# Patient Record
Sex: Male | Born: 1950 | Race: White | Hispanic: No
Health system: Southern US, Community
[De-identification: ages and names within clinical notes are randomized; demographics above are authoritative.]

---

## 2016-10-05 ENCOUNTER — Inpatient Hospital Stay
Admission: AD | Admit: 2016-10-05 | Discharge: 2016-10-27 | Disposition: A | Discharge status: Skilled Nursing Facility | Payer: Medicare Other | Source: Ambulatory Visit | Attending: Internal Medicine | Admitting: Internal Medicine

## 2016-10-05 ENCOUNTER — Institutional Professional Consult (permissible substitution) (HOSPITAL_COMMUNITY): Payer: Medicare Other

## 2016-10-05 DIAGNOSIS — Z0189 Encounter for other specified special examinations: Secondary | ICD-10-CM

## 2016-10-05 DIAGNOSIS — Z4659 Encounter for fitting and adjustment of other gastrointestinal appliance and device: Secondary | ICD-10-CM

## 2016-10-05 DIAGNOSIS — J189 Pneumonia, unspecified organism: Secondary | ICD-10-CM

## 2016-10-05 DIAGNOSIS — J449 Chronic obstructive pulmonary disease, unspecified: Secondary | ICD-10-CM

## 2016-10-05 DIAGNOSIS — J9690 Respiratory failure, unspecified, unspecified whether with hypoxia or hypercapnia: Secondary | ICD-10-CM

## 2016-10-05 DIAGNOSIS — R05 Cough: Secondary | ICD-10-CM

## 2016-10-05 DIAGNOSIS — S37009A Unspecified injury of unspecified kidney, initial encounter: Secondary | ICD-10-CM

## 2016-10-05 DIAGNOSIS — R059 Cough, unspecified: Secondary | ICD-10-CM

## 2016-10-05 DIAGNOSIS — N289 Disorder of kidney and ureter, unspecified: Secondary | ICD-10-CM

## 2016-10-06 ENCOUNTER — Other Ambulatory Visit (HOSPITAL_COMMUNITY): Payer: Medicare Other

## 2016-10-06 LAB — CBC WITH DIFFERENTIAL/PLATELET
BASOS ABS: 0 10*3/uL (ref 0.0–0.1)
Basophils Relative: 0 %
EOS PCT: 0 %
Eosinophils Absolute: 0 10*3/uL (ref 0.0–0.7)
HCT: 25 % — ABNORMAL LOW (ref 39.0–52.0)
Hemoglobin: 8.2 g/dL — ABNORMAL LOW (ref 13.0–17.0)
LYMPHS PCT: 8 %
Lymphs Abs: 0.4 10*3/uL — ABNORMAL LOW (ref 0.7–4.0)
MCH: 31.2 pg (ref 26.0–34.0)
MCHC: 32.8 g/dL (ref 30.0–36.0)
MCV: 95.1 fL (ref 78.0–100.0)
MONO ABS: 0.1 10*3/uL (ref 0.1–1.0)
Monocytes Relative: 3 %
Neutro Abs: 4.1 10*3/uL (ref 1.7–7.7)
Neutrophils Relative %: 89 %
PLATELETS: 48 10*3/uL — AB (ref 150–400)
RBC: 2.63 MIL/uL — ABNORMAL LOW (ref 4.22–5.81)
RDW: 18.1 % — AB (ref 11.5–15.5)
WBC: 4.6 10*3/uL (ref 4.0–10.5)

## 2016-10-06 LAB — COMPREHENSIVE METABOLIC PANEL
ALK PHOS: 53 U/L (ref 38–126)
ALT: 19 U/L (ref 17–63)
AST: 24 U/L (ref 15–41)
Albumin: 2.9 g/dL — ABNORMAL LOW (ref 3.5–5.0)
Anion gap: 13 (ref 5–15)
BILIRUBIN TOTAL: 1.2 mg/dL (ref 0.3–1.2)
BUN: 39 mg/dL — ABNORMAL HIGH (ref 6–20)
CALCIUM: 7.9 mg/dL — AB (ref 8.9–10.3)
CO2: 26 mmol/L (ref 22–32)
Chloride: 108 mmol/L (ref 101–111)
Creatinine, Ser: 3.44 mg/dL — ABNORMAL HIGH (ref 0.61–1.24)
GFR calc non Af Amer: 18 mL/min — ABNORMAL LOW (ref >60)
GFR, EST AFRICAN AMERICAN: 20 mL/min — AB (ref >60)
GLUCOSE: 162 mg/dL — AB (ref 65–99)
Potassium: 2.7 mmol/L — CL (ref 3.5–5.1)
Sodium: 147 mmol/L — ABNORMAL HIGH (ref 135–145)
TOTAL PROTEIN: 5.2 g/dL — AB (ref 6.5–8.1)

## 2016-10-06 LAB — TSH: TSH: 4.23 u[IU]/mL (ref 0.350–4.500)

## 2016-10-06 LAB — PROTIME-INR
INR: 1.41
Prothrombin Time: 17.4 seconds — ABNORMAL HIGH (ref 11.4–15.2)

## 2016-10-06 LAB — MAGNESIUM: Magnesium: 2.1 mg/dL (ref 1.7–2.4)

## 2016-10-06 LAB — PHOSPHORUS: Phosphorus: 2.7 mg/dL (ref 2.5–4.6)

## 2016-10-06 NOTE — Consult Note (Signed)
[facility] CONSULT NOTE    Date: 10/06/2016                  Patient Name:  Luke Robertson  MRN: [id number]  DOB: August 02, 1951  Age / Sex: 66 y.o., male         PCP: No primary care provider on file.                 Service Requesting Consult: Hospitalist                 Reason for Consult: Acute renal failure/CKD stage III            History of Present Illness: Patient is a 66 y.o. male with a PMHx of myocardial infarction, coronary artery disease, hypertension, hyperlipidemia, peripheral arterial disease, paroxysmal supraventricular tachycardia, COPD, history of histoplasmosis infection, chronic kidney disease stage III baseline creatinine 1.3, who was admitted to [facility] on 10/05/2016 for ongoing management of multiple medical problems.  The patient underwent exploratory laparotomy on 09/30/16.  He underwent extensive lysis of adhesions lasti approximately 2 hours.  His postoperative course was complicated with acute respiratory failure requiring mechanical ventilation. He was extubated on 10/01/16. He also developed acute kidney injury. His baseline creatinine is 1.3. [facility] He continues to make urine at this point in time. However creatinine appears to be trending up.  He also has anemia of chronic kidney disease and did receive blood transfusion.  Medications: aspirin (ECOTRIN) 81 MG tablet  Take 81 mg by mouth daily.      atorvastatin (LIPITOR) 20 MG tablet  Take 20 mg by mouth nightly.       B-complex with vitamin C tablet  Take 1 tablet by mouth daily.      tiotropium (SPIRIVA) 18 mcg inhalation capsule  Place 18 mcg into inhaler and inhale daily.      albuterol (PROVENTIL HFA;VENTOLIN HFA) 90 mcg/actuation inhaler  Inhale 2 puffs every four (4) hours as needed for wheezing. 1 Inhaler  0 05/14/2016 05/14/2017  gabapentin (NEURONTIN) 300 MG capsule  Take 1 capsule (300 mg  total) by mouth Three (3) times a day. 90 capsule  0 10/05/2016 11/04/2016  hydrALAZINE (APRESOLINE) 20 mg/mL injection  Infuse 0.5 mL (10 mg total) into a venous catheter every two (2) hours as needed (sbp >160 or dbp >100). 1 mL  0 10/05/2016 11/04/2016  hydrocortisone sod succ (SOLU-CORTEF) SolR  Infuse 1 mL (50 mg total) into a venous catheter daily.  0 10/09/2016   hydrocortisone sod succ (SOLU-CORTEF) SolR  Infuse 1 mL (50 mg total) into a venous catheter every twelve (12) hours.  0 10/06/2016   hydrocortisone sod succ (SOLU-CORTEF) SolR  Infuse 1 mL (50 mg total) into a venous catheter every eight (8) hours.  0 10/05/2016   HYDROmorphone (DILAUDID) 1 mg/mL Soln  Infuse 0.25 mL (0.25 mg total) into a venous catheter every three (3) hours as needed (pain 6-10). for up to 5 days  0 10/05/2016 10/10/2016  labetalol 20 mg/4 mL (5 mg/mL) Syrg  Infuse 2 mL (10 mg total) into a venous catheter every four (4) hours as needed (for SBP > 180).  0 10/05/2016 11/04/2016  Lactobacillus acidophilus cap capsule  Take 1 capsule by mouth Three (3) times a day with a meal. 90 capsule  0 10/05/2016 11/04/2016  multivitamin (TAB-A-VITE/THERAGRAN) per tablet  Take 1 tablet by mouth daily. 30 tablet  11 10/06/2016 10/06/2017  niacin 500 MG tablet  Take 1 tablet (500 mg total) by mouth daily with breakfast. 90 tablet  3 10/06/2016 10/06/2017  nitroglycerin (NITROSTAT) 0.4 MG SL tablet  Place 0.4 mg under the tongue every five (5) minutes as needed.   11/04/2015   nystatin (MYCOSTATIN) 100,000 unit/mL suspension  Take 5 mL (500,000 Units total) by mouth Four (4) times a day. 60 mL  0 10/05/2016   pantoprazole (PROTONIX) 40 mg GrPS  1 packet (40 mg total) by NG tube route daily. 30 packet  0 10/06/2016 11/05/2016  piperacillin-tazobactam (ZOSYN) 2.25 gram/50 mL IVPB  Infuse 50 mL (2.25 g total) into a venous catheter Every six (6) hours. 50 mL  0 10/05/2016   traMADol (ULTRAM) 50 mg  tablet  Take 0.5 tablets (25 mg total) by mouth every four (4) hours as needed (pain). for up to 5 days  0 10/05/2016 10/10/2016     Allergies:    Past Medical History: Myocardial infarction Coronary artery disease Hypertension Hyperlipidemia Peripheral arterial disease Paroxysmal supraventricular tachycardia COPD History of histoplasmosis infection Chronic kidney disease stage III baseline creatinine 1.5  Past Surgical History: Aortobifemoral bypass Penile prosthesis implant Hernia repair Coronary stent placement in 2010   Family History: Father with history of hypertensio mother with history of diabetes mellitus type 2  Social History: Patient has history of 20 pack years of tobacco abuse.  No current alcohol use.   Review of Systems: Review of Systems  Constitutional: Positive for malaise/fatigue and weight loss. Negative for chills and fever.  HENT: Negative for ear discharge, ear pain, hearing loss and tinnitus.   Eyes: Negative for blurred vision, double vision and photophobia.  Respiratory: Positive for cough and shortness of breath. Negative for hemoptysis and sputum production.   Cardiovascular: Negative for chest pain, palpitations and orthopnea.  Gastrointestinal: Positive for abdominal pain and nausea. Negative for heartburn.  Genitourinary: Negative for dysuria, frequency and urgency.  Musculoskeletal: Negative for back pain and myalgias.  Skin: Negative for itching and rash.  Neurological: Negative for dizziness and focal weakness.  Endo/Heme/Allergies: Negative for polydipsia. Does not bruise/bleed easily.  Psychiatric/Behavioral: Negative for depression. The patient is not nervous/anxious.      Vital Signs: Temperature 96.8 pulse 85 respirations 23 blood pressure 137/77  Physical Exam: General: NAD, sitting u in bed  Head: Normocephalic, atraumatic.  Eyes: Anicteric, EOMI  Nose: Mucous membranes moist, not inflammed, nonerythematous.   Throat: Oropharynx nonerythematous, no exudate appreciated.   Neck: Supple, trachea midline.  Lungs:  Normal respiratory effort. Basilar rales  Heart: S1S2 irregular at times  Abdomen:  Distended, bowel sounds present, midline abdominal scar noted.  Extremities: No pretibial edema.  Neurologic: A&O X3, Motor strength is 5/5 in the all 4 extremities  Skin: No visible rashes, scars.    Lab results: Basic Metabolic Panel:  Recent Labs Lab 10/06/16 0548  NA 147*  K 2.7*  CL 108  CO2 26  GLUCOSE 162*  BUN 39*  CREATININE 3.44*  CALCIUM 7.9*  MG 2.1  PHOS 2.7    Liver Function Tests:  Recent Labs Lab 10/06/16 0548  AST 24  ALT 19  ALKPHOS 53  BILITOT 1.2  PROT 5.2*  ALBUMIN 2.9*   No results for input(s): LIPASE, AMYLASE in the last 168 hours. No results for input(s): AMMONIA in the last 168 hours.  CBC:  Recent Labs Lab 10/06/16 0548  WBC 4.6  NEUTROABS 4.1  HGB 8.2*  HCT 25.0*  MCV  95.1  PLT 48*    Cardiac Enzymes: No results for input(s): CKTOTAL, CKMB, CKMBINDEX, TROPONINI in the last 168 hours.  BNP: Invalid input(s): POCBNP  CBG: No results for input(s): GLUCAP in the last 168 hours.  Microbiology: No results found for this or any previous visit.  Coagulation Studies:  Recent Labs  10/06/16 0548  LABPROT 17.4*  INR 1.41    Urinalysis: No results for input(s): COLORURINE, LABSPEC, PHURINE, GLUCOSEU, HGBUR, BILIRUBINUR, KETONESUR, PROTEINUR, UROBILINOGEN, NITRITE, LEUKOCYTESUR in the last 72 hours.  Invalid input(s): APPERANCEUR    Imaging: Dg Chest Port 1 View  Result Date: 10/05/2016 CLINICAL DATA:  Admission chest radiograph.  Initial encounter. EXAM: PORTABLE CHEST 1 VIEW COMPARISON:  None. FINDINGS: The patient's enteric tube is noted extending below the diaphragm. Vascular congestion is noted. Increased interstitial markings raise concern for pulmonary edema. Small bilateral pleural effusions are seen. No pneumothorax is  identified. The cardiomediastinal silhouette is mildly enlarged. No acute osseous abnormalities are identified. IMPRESSION: Vascular congestion and mild cardiomegaly. Increased interstitial markings raise concern for pulmonary edema. Small bilateral pleural effusions seen. Electronically Signed   By: Roanna Raider M.D.   On: 10/05/2016 17:18   Dg Abd Portable 1v  Result Date: 10/06/2016 CLINICAL DATA:  Nasogastric tube replacement EXAM: PORTABLE ABDOMEN - 1 VIEW COMPARISON:  Yesterday FINDINGS: Nasogastric tube tip is at the descending segment duodenum. Unchanged obscuration of the left diaphragm. Nonobstructive bowel gas pattern. IMPRESSION: Nasogastric tube tip is at the proximal duodenum. Electronically Signed   By: Marnee Spring M.D.   On: 10/06/2016 07:57   Dg Abd Portable 1v  Result Date: 10/05/2016 CLINICAL DATA:  NG tube placement EXAM: PORTABLE ABDOMEN - 1 VIEW COMPARISON:  None. FINDINGS: Enteric tube terminates in the proximal duodenum. Mildly prominent loops of bowel in the central abdomen. Ventral hernia mesh and central midline skin staples. IMPRESSION: Enteric tube terminates in the proximal duodenum. Mildly prominent loops of bowel in the central abdomen, likely postoperative ileus. Electronically Signed   By: Charline Bills M.D.   On: 10/05/2016 16:50      Assessment & Plan: Pt is a 66 y.o. male with a PMHx of myocardial infarction, coronary artery disease, hypertension, hyperlipidemia, peripheral arterial disease, paroxysmal supraventricular tachycardia, COPD, history of histoplasmosis infection, chronic kidney disease stage III baseline creatinine 1.3, who was admitted to [facility] on 10/05/2016 for ongoing management of multiple medical problems.  The patient underwent exploratory laparotomy on 09/30/16.  He underwent extensive lysis of adhesions lasti approximately 2 hours.  His postoperative course was complicated with acute respiratory failure requiring mechanical  ventilation. He was extubated on 10/01/16. He also developed acute kidney injury. His baseline creatinine is 1.3.  1.  Acute renal failure. 2.  Chronic kidney disease stage III baseline creatinine 1.3 3. Anemia chronic kidney disease with a recent blood transfusion. 4. Exploratory laparotomy status post lysis of adhesions  Plan: The [facility].He had exploratory laparotomy with lysis of adhesions. Suspect acute renal failureis likely related to his concurrent illness. Patient is still making significant urine at this point in time. Therefore urgent indication for dialysis. However we will need to continue to monitor BUN and creatinine trend for now. Avoid nephrotoxins as possible. We did explain to the patient should his renal flexion continued to deteriorate we may need to consider renal replacement therapy.

## 2016-10-07 ENCOUNTER — Other Ambulatory Visit (HOSPITAL_COMMUNITY): Payer: Medicare Other

## 2016-10-07 LAB — CBC WITH DIFFERENTIAL/PLATELET
BASOS PCT: 0 %
Basophils Absolute: 0 10*3/uL (ref 0.0–0.1)
EOS ABS: 0 10*3/uL (ref 0.0–0.7)
Eosinophils Relative: 0 %
HCT: 23.9 % — ABNORMAL LOW (ref 39.0–52.0)
Hemoglobin: 7.9 g/dL — ABNORMAL LOW (ref 13.0–17.0)
Lymphocytes Relative: 7 %
Lymphs Abs: 0.4 10*3/uL — ABNORMAL LOW (ref 0.7–4.0)
MCH: 31.3 pg (ref 26.0–34.0)
MCHC: 33.1 g/dL (ref 30.0–36.0)
MCV: 94.8 fL (ref 78.0–100.0)
MONO ABS: 0.2 10*3/uL (ref 0.1–1.0)
Monocytes Relative: 4 %
Neutro Abs: 4.7 10*3/uL (ref 1.7–7.7)
Neutrophils Relative %: 89 %
PLATELETS: 65 10*3/uL — AB (ref 150–400)
RBC: 2.52 MIL/uL — ABNORMAL LOW (ref 4.22–5.81)
RDW: 17.9 % — ABNORMAL HIGH (ref 11.5–15.5)
WBC: 5.3 10*3/uL (ref 4.0–10.5)

## 2016-10-07 LAB — RENAL FUNCTION PANEL
Albumin: 2.7 g/dL — ABNORMAL LOW (ref 3.5–5.0)
Anion gap: 12 (ref 5–15)
BUN: 41 mg/dL — ABNORMAL HIGH (ref 6–20)
CALCIUM: 8.1 mg/dL — AB (ref 8.9–10.3)
CO2: 23 mmol/L (ref 22–32)
CREATININE: 3.31 mg/dL — AB (ref 0.61–1.24)
Chloride: 112 mmol/L — ABNORMAL HIGH (ref 101–111)
GFR calc Af Amer: 21 mL/min — ABNORMAL LOW (ref >60)
GFR calc non Af Amer: 18 mL/min — ABNORMAL LOW (ref >60)
GLUCOSE: 136 mg/dL — AB (ref 65–99)
Phosphorus: 2.7 mg/dL (ref 2.5–4.6)
Potassium: 2.4 mmol/L — CL (ref 3.5–5.1)
SODIUM: 147 mmol/L — AB (ref 135–145)

## 2016-10-07 LAB — HEMOGLOBIN A1C
Hgb A1c MFr Bld: 5.3 % (ref 4.8–5.6)
Mean Plasma Glucose: 105 mg/dL

## 2016-10-07 LAB — MAGNESIUM: MAGNESIUM: 2 mg/dL (ref 1.7–2.4)

## 2016-10-08 LAB — RENAL FUNCTION PANEL
Albumin: 2.2 g/dL — ABNORMAL LOW (ref 3.5–5.0)
Anion gap: 9 (ref 5–15)
BUN: 34 mg/dL — AB (ref 6–20)
CO2: 20 mmol/L — AB (ref 22–32)
Calcium: 7 mg/dL — ABNORMAL LOW (ref 8.9–10.3)
Chloride: 91 mmol/L — ABNORMAL LOW (ref 101–111)
Creatinine, Ser: 2.74 mg/dL — ABNORMAL HIGH (ref 0.61–1.24)
GFR calc Af Amer: 27 mL/min — ABNORMAL LOW (ref >60)
GFR calc non Af Amer: 23 mL/min — ABNORMAL LOW (ref >60)
GLUCOSE: 954 mg/dL — AB (ref 65–99)
Phosphorus: 2.5 mg/dL (ref 2.5–4.6)
Potassium: 2.4 mmol/L — CL (ref 3.5–5.1)
Sodium: 120 mmol/L — ABNORMAL LOW (ref 135–145)

## 2016-10-08 LAB — BASIC METABOLIC PANEL
Anion gap: 12 (ref 5–15)
BUN: 43 mg/dL — AB (ref 6–20)
CHLORIDE: 112 mmol/L — AB (ref 101–111)
CO2: 21 mmol/L — AB (ref 22–32)
CREATININE: 3.11 mg/dL — AB (ref 0.61–1.24)
Calcium: 8.7 mg/dL — ABNORMAL LOW (ref 8.9–10.3)
GFR calc non Af Amer: 20 mL/min — ABNORMAL LOW (ref >60)
GFR, EST AFRICAN AMERICAN: 23 mL/min — AB (ref >60)
Glucose, Bld: 149 mg/dL — ABNORMAL HIGH (ref 65–99)
Potassium: 3.1 mmol/L — ABNORMAL LOW (ref 3.5–5.1)
Sodium: 145 mmol/L (ref 135–145)

## 2016-10-08 LAB — MAGNESIUM: MAGNESIUM: 1.6 mg/dL — AB (ref 1.7–2.4)

## 2016-10-08 NOTE — Progress Notes (Signed)
[facility]  ROUNDING NOTE   Subjective:  No new renal function testing this a.m. However her creatinine was down to 3.3 yesterday. Patient resting comfortably in bed.    Objective:  Vital signs in last 24 hours:  Temperature 97.7 pulse 99 respirations 24 blood pressure 133/65  Physical Exam: General: No acute distress  Head: NG in place  Eyes: Anicteric  Neck: Supple, trachea midline  Lungs:  Clear to auscultation, normal effort  Heart: S1S2 no rubs  Abdomen:  Midline abdominal wound with dressing, distended, BS present  Extremities: 1+ peripheral edema.  Neurologic: Nonfocal, moving all four extremities  Skin: No lesions       Basic Metabolic Panel:  Recent Labs Lab 10/06/16 0548 10/07/16 0612  NA 147* 147*  K 2.7* 2.4*  CL 108 112*  CO2 26 23  GLUCOSE 162* 136*  BUN 39* 41*  CREATININE 3.44* 3.31*  CALCIUM 7.9* 8.1*  MG 2.1 2.0  PHOS 2.7 2.7    Liver Function Tests:  Recent Labs Lab 10/06/16 0548 10/07/16 0612  AST 24  --   ALT 19  --   ALKPHOS 53  --   BILITOT 1.2  --   PROT 5.2*  --   ALBUMIN 2.9* 2.7*   No results for input(s): LIPASE, AMYLASE in the last 168 hours. No results for input(s): AMMONIA in the last 168 hours.  CBC:  Recent Labs Lab 10/06/16 0548 10/07/16 0612  WBC 4.6 5.3  NEUTROABS 4.1 4.7  HGB 8.2* 7.9*  HCT 25.0* 23.9*  MCV 95.1 94.8  PLT 48* 65*    Cardiac Enzymes: No results for input(s): CKTOTAL, CKMB, CKMBINDEX, TROPONINI in the last 168 hours.  BNP: Invalid input(s): POCBNP  CBG: No results for input(s): GLUCAP in the last 168 hours.  Microbiology: No results found for this or any previous visit.  Coagulation Studies:  Recent Labs  10/06/16 0548  LABPROT 17.4*  INR 1.41    Urinalysis: No results for input(s): COLORURINE, LABSPEC, PHURINE, GLUCOSEU, HGBUR, BILIRUBINUR, KETONESUR, PROTEINUR, UROBILINOGEN, NITRITE, LEUKOCYTESUR in the last 72 hours.  Invalid input(s): APPERANCEUR     Imaging: [place] [facility]  Result Date: 10/06/2016 CLINICAL DATA:  Acute onset of renal insufficiency. Initial encounter. EXAM: RENAL / URINARY TRACT ULTRASOUND COMPLETE COMPARISON:  None. FINDINGS: Right Kidney: Length: 10.7 cm. Mildly increased parenchymal echogenicity suggested. No mass or hydronephrosis visualized. Left Kidney: Length: 11.6 cm. Mildly increased parenchymal echogenicity suggested. No mass or hydronephrosis visualized. Bladder: Decompressed and not well assessed, with a Foley catheter in place. Moderate volume ascites is noted within the abdomen and pelvis. IMPRESSION: 1. No evidence of hydronephrosis. 2. Suggestion of mildly increased renal parenchymal echogenicity, which may reflect medical renal disease. 3. Moderate volume ascites within the abdomen and pelvis. Electronically Signed   By: Roanna RaiderJeffery  Chang M.D.   On: 10/06/2016 23:04   Dg Abd Portable 1v  Result Date: 10/07/2016 CLINICAL DATA:  Nasogastric tube placement.  Initial encounter. EXAM: PORTABLE ABDOMEN - 1 VIEW COMPARISON:  Abdominal radiograph performed 10/06/2016 FINDINGS: The patient's enteric tube is noted ending about the gastroesophageal junction. This could be advanced at least 10 cm. The visualized bowel gas pattern is grossly unremarkable. Skin staples are seen about the mid abdomen. No acute osseous abnormalities are seen. IMPRESSION: Enteric tube noted ending about the gastroesophageal junction. This could be advanced at least 10 cm, as deemed clinically appropriate. Electronically Signed   By: Roanna RaiderJeffery  Chang M.D.   On: 10/07/2016 02:07   Dg  Abd Portable 1v  Result Date: 10/06/2016 CLINICAL DATA:  NG placement EXAM: PORTABLE ABDOMEN - 1 VIEW COMPARISON:  10/06/2016 FINDINGS: NG tube is been pulled back with the tip now in the proximal body of the stomach. Nonobstructive bowel gas pattern. IMPRESSION: NG pulled back into the proximal body of the stomach. Electronically Signed   By: Marlan Palau M.D.   On:  10/06/2016 21:07     Medications:       Assessment/ Plan:  66 y.o. male with a PMHx of myocardial infarction, coronary artery disease, hypertension, hyperlipidemia, peripheral arterial disease, paroxysmal supraventricular tachycardia, COPD, history of histoplasmosis infection, chronic kidney disease stage III baseline creatinine 1.3, who was admitted to [facility] on 10/05/2016 for ongoing management of multiple medical problems.  The patient underwent exploratory laparotomy on 09/30/16.  He underwent extensive lysis of adhesions.  His postoperative course was complicated with acute respiratory failure requiring mechanical ventilation. He was extubated on 10/01/16. He also developed acute kidney injury. His baseline creatinine is 1.3.  1.  Acute renal failure, secondary to concurrent illness. 2.  Chronic kidney disease stage III baseline creatinine 1.3 3. Anemia chronic kidney disease with a recent blood transfusion. 4. Exploratory laparotomy status post lysis of adhesions. 5.  Hypernatremia.   6.  Hypokalemia.   Plan:  No new renal function testing available the same. Therefore we will order renal function panel today as well as on Monday. Patient is incontinent of urine therefore exact amount but unknown at this time. Creatinine was slightly lower yesterday at 3.3. Continue to monitor renal function 10 for now. No urgent indication for dialysis however this is a possibility of renal function were to worsen. This was explained to the patient today. Avoid nephrotoxins as possible. We plan to see the patient  Again on Monday.  Consider increasing free water flush with tube feeds as possible.  Potassium also low at 2.4. We will administer potassium chloride 40 mg IV x1.   LOS: 0 Freeland Pracht 2/16/20188:05 AM

## 2016-10-09 ENCOUNTER — Other Ambulatory Visit (HOSPITAL_COMMUNITY): Payer: Medicare Other

## 2016-10-10 LAB — BASIC METABOLIC PANEL
ANION GAP: 7 (ref 5–15)
Anion gap: 8 (ref 5–15)
BUN: 38 mg/dL — AB (ref 6–20)
BUN: 39 mg/dL — AB (ref 6–20)
CHLORIDE: 110 mmol/L (ref 101–111)
CO2: 25 mmol/L (ref 22–32)
CO2: 25 mmol/L (ref 22–32)
CREATININE: 2.46 mg/dL — AB (ref 0.61–1.24)
Calcium: 8.7 mg/dL — ABNORMAL LOW (ref 8.9–10.3)
Calcium: 8.7 mg/dL — ABNORMAL LOW (ref 8.9–10.3)
Chloride: 112 mmol/L — ABNORMAL HIGH (ref 101–111)
Creatinine, Ser: 2.32 mg/dL — ABNORMAL HIGH (ref 0.61–1.24)
GFR calc Af Amer: 33 mL/min — ABNORMAL LOW (ref >60)
GFR calc non Af Amer: 26 mL/min — ABNORMAL LOW (ref >60)
GFR, EST AFRICAN AMERICAN: 30 mL/min — AB (ref >60)
GFR, EST NON AFRICAN AMERICAN: 28 mL/min — AB (ref >60)
GLUCOSE: 156 mg/dL — AB (ref 65–99)
Glucose, Bld: 152 mg/dL — ABNORMAL HIGH (ref 65–99)
POTASSIUM: 2.8 mmol/L — AB (ref 3.5–5.1)
POTASSIUM: 2.8 mmol/L — AB (ref 3.5–5.1)
SODIUM: 143 mmol/L (ref 135–145)
Sodium: 144 mmol/L (ref 135–145)

## 2016-10-11 LAB — CBC
HEMATOCRIT: 31.1 % — AB (ref 39.0–52.0)
Hemoglobin: 9.8 g/dL — ABNORMAL LOW (ref 13.0–17.0)
MCH: 32.2 pg (ref 26.0–34.0)
MCHC: 31.5 g/dL (ref 30.0–36.0)
MCV: 102.3 fL — AB (ref 78.0–100.0)
Platelets: 69 10*3/uL — ABNORMAL LOW (ref 150–400)
RBC: 3.04 MIL/uL — ABNORMAL LOW (ref 4.22–5.81)
RDW: 23.9 % — AB (ref 11.5–15.5)
WBC: 5 10*3/uL (ref 4.0–10.5)

## 2016-10-11 LAB — BASIC METABOLIC PANEL
ANION GAP: 10 (ref 5–15)
Anion gap: 9 (ref 5–15)
BUN: 38 mg/dL — AB (ref 6–20)
BUN: 37 mg/dL — ABNORMAL HIGH (ref 6–20)
CALCIUM: 8.7 mg/dL — AB (ref 8.9–10.3)
CHLORIDE: 112 mmol/L — AB (ref 101–111)
CHLORIDE: 111 mmol/L (ref 101–111)
CO2: 26 mmol/L (ref 22–32)
CO2: 23 mmol/L (ref 22–32)
Calcium: 8.9 mg/dL (ref 8.9–10.3)
Creatinine, Ser: 2.14 mg/dL — ABNORMAL HIGH (ref 0.61–1.24)
Creatinine, Ser: 1.97 mg/dL — ABNORMAL HIGH (ref 0.61–1.24)
GFR calc Af Amer: 36 mL/min — ABNORMAL LOW (ref >60)
GFR calc Af Amer: 40 mL/min — ABNORMAL LOW (ref >60)
GFR calc non Af Amer: 31 mL/min — ABNORMAL LOW (ref >60)
GFR calc non Af Amer: 34 mL/min — ABNORMAL LOW (ref >60)
GLUCOSE: 142 mg/dL — AB (ref 65–99)
GLUCOSE: 144 mg/dL — AB (ref 65–99)
POTASSIUM: 2.9 mmol/L — AB (ref 3.5–5.1)
POTASSIUM: 3.2 mmol/L — AB (ref 3.5–5.1)
SODIUM: 147 mmol/L — AB (ref 135–145)
Sodium: 144 mmol/L (ref 135–145)

## 2016-10-11 LAB — RENAL FUNCTION PANEL
Albumin: 2.4 g/dL — ABNORMAL LOW (ref 3.5–5.0)
Anion gap: 10 (ref 5–15)
BUN: 38 mg/dL — AB (ref 6–20)
CHLORIDE: 109 mmol/L (ref 101–111)
CO2: 26 mmol/L (ref 22–32)
Calcium: 8.9 mg/dL (ref 8.9–10.3)
Creatinine, Ser: 2.2 mg/dL — ABNORMAL HIGH (ref 0.61–1.24)
GFR calc Af Amer: 35 mL/min — ABNORMAL LOW (ref >60)
GFR calc non Af Amer: 30 mL/min — ABNORMAL LOW (ref >60)
GLUCOSE: 139 mg/dL — AB (ref 65–99)
POTASSIUM: 2.5 mmol/L — AB (ref 3.5–5.1)
Phosphorus: 2.7 mg/dL (ref 2.5–4.6)
Sodium: 145 mmol/L (ref 135–145)

## 2016-10-11 NOTE — Progress Notes (Signed)
[facility]  ROUNDING NOTE   Subjective:   Patient resting comfortably in bed. Cr improved to 2.14   Objective:  Vital signs in last 24 hours:  Temperature 96.9 pulse 83 respirations 20 blood pressure 139/80  Physical Exam: General: No acute distress  Head: NG in place  Eyes: Anicteric  Neck: Supple, trachea midline  Lungs:  Clear to auscultation, normal effort  Heart: S1S2 no rubs  Abdomen:  Midline abdominal wound with dressing, distended, BS present  Extremities: 1+ peripheral edema.  Neurologic: Nonfocal, moving all four extremities  Skin: No lesions       Basic Metabolic Panel:  Recent Labs Lab 10/06/16 0548 10/07/16 0612 10/08/16 0811 10/08/16 1035 10/10/16 0513 10/10/16 1609 10/11/16 0634 10/11/16 1330  NA 147* 147* 120* 145 143 144 145 147*  K 2.7* 2.4* 2.4* 3.1* 2.8* 2.8* 2.5* 2.9*  CL 108 112* 91* 112* 110 112* 109 112*  CO2 26 23 20* 21* 25 25 26 26   GLUCOSE 162* 136* 954* 149* 152* 156* 139* 142*  BUN 39* 41* 34* 43* 38* 39* 38* 38*  CREATININE 3.44* 3.31* 2.74* 3.11* 2.46* 2.32* 2.20* 2.14*  CALCIUM 7.9* 8.1* 7.0* 8.7* 8.7* 8.7* 8.9 8.9  MG 2.1 2.0 1.6*  --   --   --   --   --   PHOS 2.7 2.7 2.5  --   --   --  2.7  --     Liver Function Tests:  Recent Labs Lab 10/06/16 0548 10/07/16 0612 10/08/16 0811 10/11/16 0634  AST 24  --   --   --   ALT 19  --   --   --   ALKPHOS 53  --   --   --   BILITOT 1.2  --   --   --   PROT 5.2*  --   --   --   ALBUMIN 2.9* 2.7* 2.2* 2.4*   No results for input(s): LIPASE, AMYLASE in the last 168 hours. No results for input(s): AMMONIA in the last 168 hours.  CBC:  Recent Labs Lab 10/06/16 0548 10/07/16 0612 10/11/16 1330  WBC 4.6 5.3 5.0  NEUTROABS 4.1 4.7  --   HGB 8.2* 7.9* 9.8*  HCT 25.0* 23.9* 31.1*  MCV 95.1 94.8 102.3*  PLT 48* 65* 69*    Cardiac Enzymes: No results for input(s): CKTOTAL, CKMB, CKMBINDEX, TROPONINI in the last 168 hours.  BNP: Invalid input(s):  POCBNP  CBG: No results for input(s): GLUCAP in the last 168 hours.  Microbiology: Results for orders placed or performed during the hospital encounter of 10/05/16  Culture, respiratory (NON-Expectorated)     Status: None (Preliminary result)   Collection Time: 10/09/16 11:29 PM  Result Value Ref Range Status   Specimen Description SPUTUM  Final   Special Requests NONE  Final   Gram Stain   Final    MODERATE SQUAMOUS EPITHELIAL CELLS PRESENT MODERATE WBC PRESENT, PREDOMINANTLY PMN ABUNDANT GRAM POSITIVE COCCI IN PAIRS IN CHAINS MODERATE YEAST    Culture   Final    ABUNDANT CANDIDA ALBICANS CULTURE REINCUBATED FOR BETTER GROWTH    Report Status PENDING  Incomplete    Coagulation Studies: No results for input(s): LABPROT, INR in the last 72 hours.  Urinalysis: No results for input(s): COLORURINE, LABSPEC, PHURINE, GLUCOSEU, HGBUR, BILIRUBINUR, KETONESUR, PROTEINUR, UROBILINOGEN, NITRITE, LEUKOCYTESUR in the last 72 hours.  Invalid input(s): APPERANCEUR    Imaging: Dg Abd Portable 1v  Result Date: 10/10/2016 CLINICAL DATA:  Gastric tube placement EXAM: PORTABLE ABDOMEN - 1 VIEW COMPARISON:  10/07/2016 FINDINGS: The end hole of a gastric tube is seen within the stomach at approximately the junction of the fundus and body of the stomach. The side port is likely at the GE junction. Further advancement is recommended 5-7 cm. IMPRESSION: The tip of a gastric tube is seen within the stomach however the side port appears to be at level the GE junction and further advancement is recommended as above. Electronically Signed   By: Tollie Ethavid  Kwon M.D.   On: 10/10/2016 00:05     Medications:       Assessment/ Plan:  66 y.o. male with a PMHx of myocardial infarction, coronary artery disease, hypertension, hyperlipidemia, peripheral arterial disease, paroxysmal supraventricular tachycardia, COPD, history of histoplasmosis infection, chronic kidney disease stage III baseline creatinine 1.3,  who was admitted to [facility] on 10/05/2016 for ongoing management of multiple medical problems.  The patient underwent exploratory laparotomy on 09/30/16.  He underwent extensive lysis of adhesions.  His postoperative course was complicated with acute respiratory failure requiring mechanical ventilation. He was extubated on 10/01/16. He also developed acute kidney injury. His baseline creatinine is 1.3.  1.  Acute renal failure, secondary to concurrent illness. 2.  Chronic kidney disease stage III baseline creatinine 1.3 3. Anemia chronic kidney disease with a recent blood transfusion. 4. Exploratory laparotomy status post lysis of adhesions. 5.  Hypernatremia.   6.  Hypokalemia.   Plan:  S Creatinine has improved Cr down to 2.14 Potassium level is also low  Avoid nephrotoxins as possible. Add spironolactone   LOS: 0 Tanyika Barros 2/19/20184:32 PM

## 2016-10-12 LAB — POTASSIUM: POTASSIUM: 3.9 mmol/L (ref 3.5–5.1)

## 2016-10-13 LAB — CULTURE, RESPIRATORY

## 2016-10-13 LAB — CULTURE, RESPIRATORY W GRAM STAIN

## 2016-10-15 LAB — CBC
HCT: 30 % — ABNORMAL LOW (ref 39.0–52.0)
HEMOGLOBIN: 9.3 g/dL — AB (ref 13.0–17.0)
MCH: 33 pg (ref 26.0–34.0)
MCHC: 31 g/dL (ref 30.0–36.0)
MCV: 106.4 fL — ABNORMAL HIGH (ref 78.0–100.0)
Platelets: 59 10*3/uL — ABNORMAL LOW (ref 150–400)
RBC: 2.82 MIL/uL — AB (ref 4.22–5.81)
RDW: 24.6 % — ABNORMAL HIGH (ref 11.5–15.5)
WBC: 4.3 10*3/uL (ref 4.0–10.5)

## 2016-10-15 LAB — BASIC METABOLIC PANEL
ANION GAP: 10 (ref 5–15)
BUN: 30 mg/dL — AB (ref 6–20)
CHLORIDE: 111 mmol/L (ref 101–111)
CO2: 24 mmol/L (ref 22–32)
Calcium: 8.7 mg/dL — ABNORMAL LOW (ref 8.9–10.3)
Creatinine, Ser: 1.64 mg/dL — ABNORMAL HIGH (ref 0.61–1.24)
GFR calc Af Amer: 50 mL/min — ABNORMAL LOW (ref >60)
GFR, EST NON AFRICAN AMERICAN: 43 mL/min — AB (ref >60)
Glucose, Bld: 126 mg/dL — ABNORMAL HIGH (ref 65–99)
POTASSIUM: 3.9 mmol/L (ref 3.5–5.1)
SODIUM: 145 mmol/L (ref 135–145)

## 2016-10-17 ENCOUNTER — Institutional Professional Consult (permissible substitution) (HOSPITAL_COMMUNITY): Payer: Medicare Other

## 2016-10-19 LAB — CBC WITH DIFFERENTIAL/PLATELET
BASOS ABS: 0 10*3/uL (ref 0.0–0.1)
Basophils Relative: 0 %
EOS ABS: 0 10*3/uL (ref 0.0–0.7)
Eosinophils Relative: 0 %
HCT: 29.1 % — ABNORMAL LOW (ref 39.0–52.0)
Hemoglobin: 9.2 g/dL — ABNORMAL LOW (ref 13.0–17.0)
LYMPHS ABS: 0.3 10*3/uL — AB (ref 0.7–4.0)
Lymphocytes Relative: 8 %
MCH: 33.6 pg (ref 26.0–34.0)
MCHC: 31.6 g/dL (ref 30.0–36.0)
MCV: 106.2 fL — ABNORMAL HIGH (ref 78.0–100.0)
MONO ABS: 0.2 10*3/uL (ref 0.1–1.0)
MONOS PCT: 4 %
NEUTROS ABS: 3.3 10*3/uL (ref 1.7–7.7)
Neutrophils Relative %: 88 %
PLATELETS: 50 10*3/uL — AB (ref 150–400)
RBC: 2.74 MIL/uL — AB (ref 4.22–5.81)
RDW: 23.3 % — AB (ref 11.5–15.5)
WBC: 3.8 10*3/uL — ABNORMAL LOW (ref 4.0–10.5)

## 2016-10-19 LAB — BASIC METABOLIC PANEL
ANION GAP: 7 (ref 5–15)
BUN: 20 mg/dL (ref 6–20)
CALCIUM: 8.3 mg/dL — AB (ref 8.9–10.3)
CO2: 29 mmol/L (ref 22–32)
CREATININE: 1.24 mg/dL (ref 0.61–1.24)
Chloride: 103 mmol/L (ref 101–111)
Glucose, Bld: 101 mg/dL — ABNORMAL HIGH (ref 65–99)
Potassium: 2.9 mmol/L — ABNORMAL LOW (ref 3.5–5.1)
SODIUM: 139 mmol/L (ref 135–145)

## 2016-10-19 LAB — MAGNESIUM: Magnesium: 1.7 mg/dL (ref 1.7–2.4)

## 2016-10-19 LAB — PHOSPHORUS: PHOSPHORUS: 2.5 mg/dL (ref 2.5–4.6)

## 2016-10-20 LAB — MAGNESIUM: Magnesium: 1.6 mg/dL — ABNORMAL LOW (ref 1.7–2.4)

## 2016-10-20 LAB — RENAL FUNCTION PANEL
ALBUMIN: 2.6 g/dL — AB (ref 3.5–5.0)
ANION GAP: 6 (ref 5–15)
BUN: 21 mg/dL — AB (ref 6–20)
CALCIUM: 8.5 mg/dL — AB (ref 8.9–10.3)
CO2: 29 mmol/L (ref 22–32)
Chloride: 103 mmol/L (ref 101–111)
Creatinine, Ser: 1.16 mg/dL (ref 0.61–1.24)
GFR calc Af Amer: >60 mL/min (ref >60)
GFR calc non Af Amer: >60 mL/min (ref >60)
GLUCOSE: 102 mg/dL — AB (ref 65–99)
PHOSPHORUS: 2.1 mg/dL — AB (ref 2.5–4.6)
POTASSIUM: 3.6 mmol/L (ref 3.5–5.1)
SODIUM: 138 mmol/L (ref 135–145)

## 2016-10-20 LAB — PHOSPHORUS: Phosphorus: 2 mg/dL — ABNORMAL LOW (ref 2.5–4.6)

## 2016-10-21 ENCOUNTER — Other Ambulatory Visit (HOSPITAL_COMMUNITY): Payer: Medicare Other

## 2016-10-22 LAB — RENAL FUNCTION PANEL
ANION GAP: 9 (ref 5–15)
Albumin: 2.8 g/dL — ABNORMAL LOW (ref 3.5–5.0)
BUN: 27 mg/dL — ABNORMAL HIGH (ref 6–20)
CHLORIDE: 99 mmol/L — AB (ref 101–111)
CO2: 28 mmol/L (ref 22–32)
Calcium: 8.6 mg/dL — ABNORMAL LOW (ref 8.9–10.3)
Creatinine, Ser: 1.34 mg/dL — ABNORMAL HIGH (ref 0.61–1.24)
GFR calc non Af Amer: 55 mL/min — ABNORMAL LOW (ref >60)
Glucose, Bld: 99 mg/dL (ref 65–99)
POTASSIUM: 3.9 mmol/L (ref 3.5–5.1)
Phosphorus: 2.4 mg/dL — ABNORMAL LOW (ref 2.5–4.6)
Sodium: 136 mmol/L (ref 135–145)

## 2016-10-22 LAB — CBC
HCT: 32.6 % — ABNORMAL LOW (ref 39.0–52.0)
Hemoglobin: 10.3 g/dL — ABNORMAL LOW (ref 13.0–17.0)
MCH: 33.4 pg (ref 26.0–34.0)
MCHC: 31.6 g/dL (ref 30.0–36.0)
MCV: 105.8 fL — AB (ref 78.0–100.0)
Platelets: 45 10*3/uL — ABNORMAL LOW (ref 150–400)
RBC: 3.08 MIL/uL — AB (ref 4.22–5.81)
RDW: 22.4 % — AB (ref 11.5–15.5)
WBC: 4 10*3/uL (ref 4.0–10.5)

## 2016-10-22 LAB — MAGNESIUM: Magnesium: 1.9 mg/dL (ref 1.7–2.4)

## 2016-10-23 ENCOUNTER — Other Ambulatory Visit (HOSPITAL_COMMUNITY): Payer: Medicare Other

## 2016-10-25 ENCOUNTER — Other Ambulatory Visit: Payer: Self-pay

## 2016-10-25 LAB — RENAL FUNCTION PANEL
ALBUMIN: 2.4 g/dL — AB (ref 3.5–5.0)
ANION GAP: 3 — AB (ref 5–15)
BUN: 21 mg/dL — ABNORMAL HIGH (ref 6–20)
CALCIUM: 8 mg/dL — AB (ref 8.9–10.3)
CO2: 35 mmol/L — AB (ref 22–32)
Chloride: 98 mmol/L — ABNORMAL LOW (ref 101–111)
Creatinine, Ser: 1.25 mg/dL — ABNORMAL HIGH (ref 0.61–1.24)
GFR calc Af Amer: >60 mL/min (ref >60)
GFR calc non Af Amer: 60 mL/min — ABNORMAL LOW (ref >60)
GLUCOSE: 100 mg/dL — AB (ref 65–99)
PHOSPHORUS: 2.3 mg/dL — AB (ref 2.5–4.6)
Potassium: 2.9 mmol/L — ABNORMAL LOW (ref 3.5–5.1)
SODIUM: 136 mmol/L (ref 135–145)

## 2016-10-25 LAB — CULTURE, RESPIRATORY W GRAM STAIN

## 2016-10-25 LAB — CBC WITH DIFFERENTIAL/PLATELET
BASOS ABS: 0 10*3/uL (ref 0.0–0.1)
Basophils Relative: 0 %
EOS ABS: 0 10*3/uL (ref 0.0–0.7)
Eosinophils Relative: 2 %
HEMATOCRIT: 27 % — AB (ref 39.0–52.0)
Hemoglobin: 8.7 g/dL — ABNORMAL LOW (ref 13.0–17.0)
LYMPHS ABS: 0.2 10*3/uL — AB (ref 0.7–4.0)
Lymphocytes Relative: 13 %
MCH: 34.5 pg — ABNORMAL HIGH (ref 26.0–34.0)
MCHC: 32.2 g/dL (ref 30.0–36.0)
MCV: 107.1 fL — ABNORMAL HIGH (ref 78.0–100.0)
MONOS PCT: 6 %
Monocytes Absolute: 0.1 10*3/uL (ref 0.1–1.0)
Neutro Abs: 1.5 10*3/uL — ABNORMAL LOW (ref 1.7–7.7)
Neutrophils Relative %: 79 %
Platelets: 35 10*3/uL — ABNORMAL LOW (ref 150–400)
RBC: 2.52 MIL/uL — AB (ref 4.22–5.81)
RDW: 21.6 % — AB (ref 11.5–15.5)
WBC: 1.8 10*3/uL — AB (ref 4.0–10.5)

## 2016-10-25 LAB — CULTURE, RESPIRATORY

## 2016-10-25 LAB — MAGNESIUM: MAGNESIUM: 2 mg/dL (ref 1.7–2.4)

## 2016-10-26 LAB — PHOSPHORUS: Phosphorus: 2.5 mg/dL (ref 2.5–4.6)

## 2016-10-26 LAB — BASIC METABOLIC PANEL
ANION GAP: 10 (ref 5–15)
BUN: 19 mg/dL (ref 6–20)
CALCIUM: 8.6 mg/dL — AB (ref 8.9–10.3)
CHLORIDE: 97 mmol/L — AB (ref 101–111)
CO2: 33 mmol/L — AB (ref 22–32)
CREATININE: 1.17 mg/dL (ref 0.61–1.24)
GFR calc Af Amer: >60 mL/min (ref >60)
GFR calc non Af Amer: >60 mL/min (ref >60)
GLUCOSE: 95 mg/dL (ref 65–99)
Potassium: 3.7 mmol/L (ref 3.5–5.1)
Sodium: 140 mmol/L (ref 135–145)

## 2016-10-26 LAB — CBC WITH DIFFERENTIAL/PLATELET
Basophils Absolute: 0 10*3/uL (ref 0.0–0.1)
Basophils Relative: 0 %
EOS PCT: 2 %
Eosinophils Absolute: 0.1 10*3/uL (ref 0.0–0.7)
HEMATOCRIT: 30.1 % — AB (ref 39.0–52.0)
HEMOGLOBIN: 9.4 g/dL — AB (ref 13.0–17.0)
LYMPHS ABS: 0.3 10*3/uL — AB (ref 0.7–4.0)
Lymphocytes Relative: 13 %
MCH: 33.3 pg (ref 26.0–34.0)
MCHC: 31.2 g/dL (ref 30.0–36.0)
MCV: 106.7 fL — AB (ref 78.0–100.0)
MONOS PCT: 6 %
Monocytes Absolute: 0.2 10*3/uL (ref 0.1–1.0)
NEUTROS ABS: 1.9 10*3/uL (ref 1.7–7.7)
Neutrophils Relative %: 79 %
Platelets: 44 10*3/uL — ABNORMAL LOW (ref 150–400)
RBC: 2.82 MIL/uL — ABNORMAL LOW (ref 4.22–5.81)
RDW: 21 % — AB (ref 11.5–15.5)
WBC: 2.5 10*3/uL — ABNORMAL LOW (ref 4.0–10.5)

## 2016-10-26 LAB — MAGNESIUM: Magnesium: 2.2 mg/dL (ref 1.7–2.4)

## 2016-10-27 LAB — BASIC METABOLIC PANEL
ANION GAP: 8 (ref 5–15)
BUN: 20 mg/dL (ref 6–20)
CHLORIDE: 95 mmol/L — AB (ref 101–111)
CO2: 32 mmol/L (ref 22–32)
Calcium: 8.4 mg/dL — ABNORMAL LOW (ref 8.9–10.3)
Creatinine, Ser: 1.13 mg/dL (ref 0.61–1.24)
Glucose, Bld: 104 mg/dL — ABNORMAL HIGH (ref 65–99)
POTASSIUM: 3.2 mmol/L — AB (ref 3.5–5.1)
SODIUM: 135 mmol/L (ref 135–145)

## 2016-10-27 LAB — CBC
HCT: 27.8 % — ABNORMAL LOW (ref 39.0–52.0)
Hemoglobin: 8.9 g/dL — ABNORMAL LOW (ref 13.0–17.0)
MCH: 33.8 pg (ref 26.0–34.0)
MCHC: 32 g/dL (ref 30.0–36.0)
MCV: 105.7 fL — ABNORMAL HIGH (ref 78.0–100.0)
Platelets: 48 10*3/uL — ABNORMAL LOW (ref 150–400)
RBC: 2.63 MIL/uL — ABNORMAL LOW (ref 4.22–5.81)
RDW: 20.9 % — ABNORMAL HIGH (ref 11.5–15.5)
WBC: 2.7 10*3/uL — ABNORMAL LOW (ref 4.0–10.5)

## 2018-02-09 IMAGING — CR DG CHEST 1V PORT
1 series · 1 of 1 positions shown · non-contrast
Comparison: None.

CLINICAL DATA: Admission chest radiograph.  Initial encounter.

EXAM:
PORTABLE CHEST 1 VIEW

[portable]
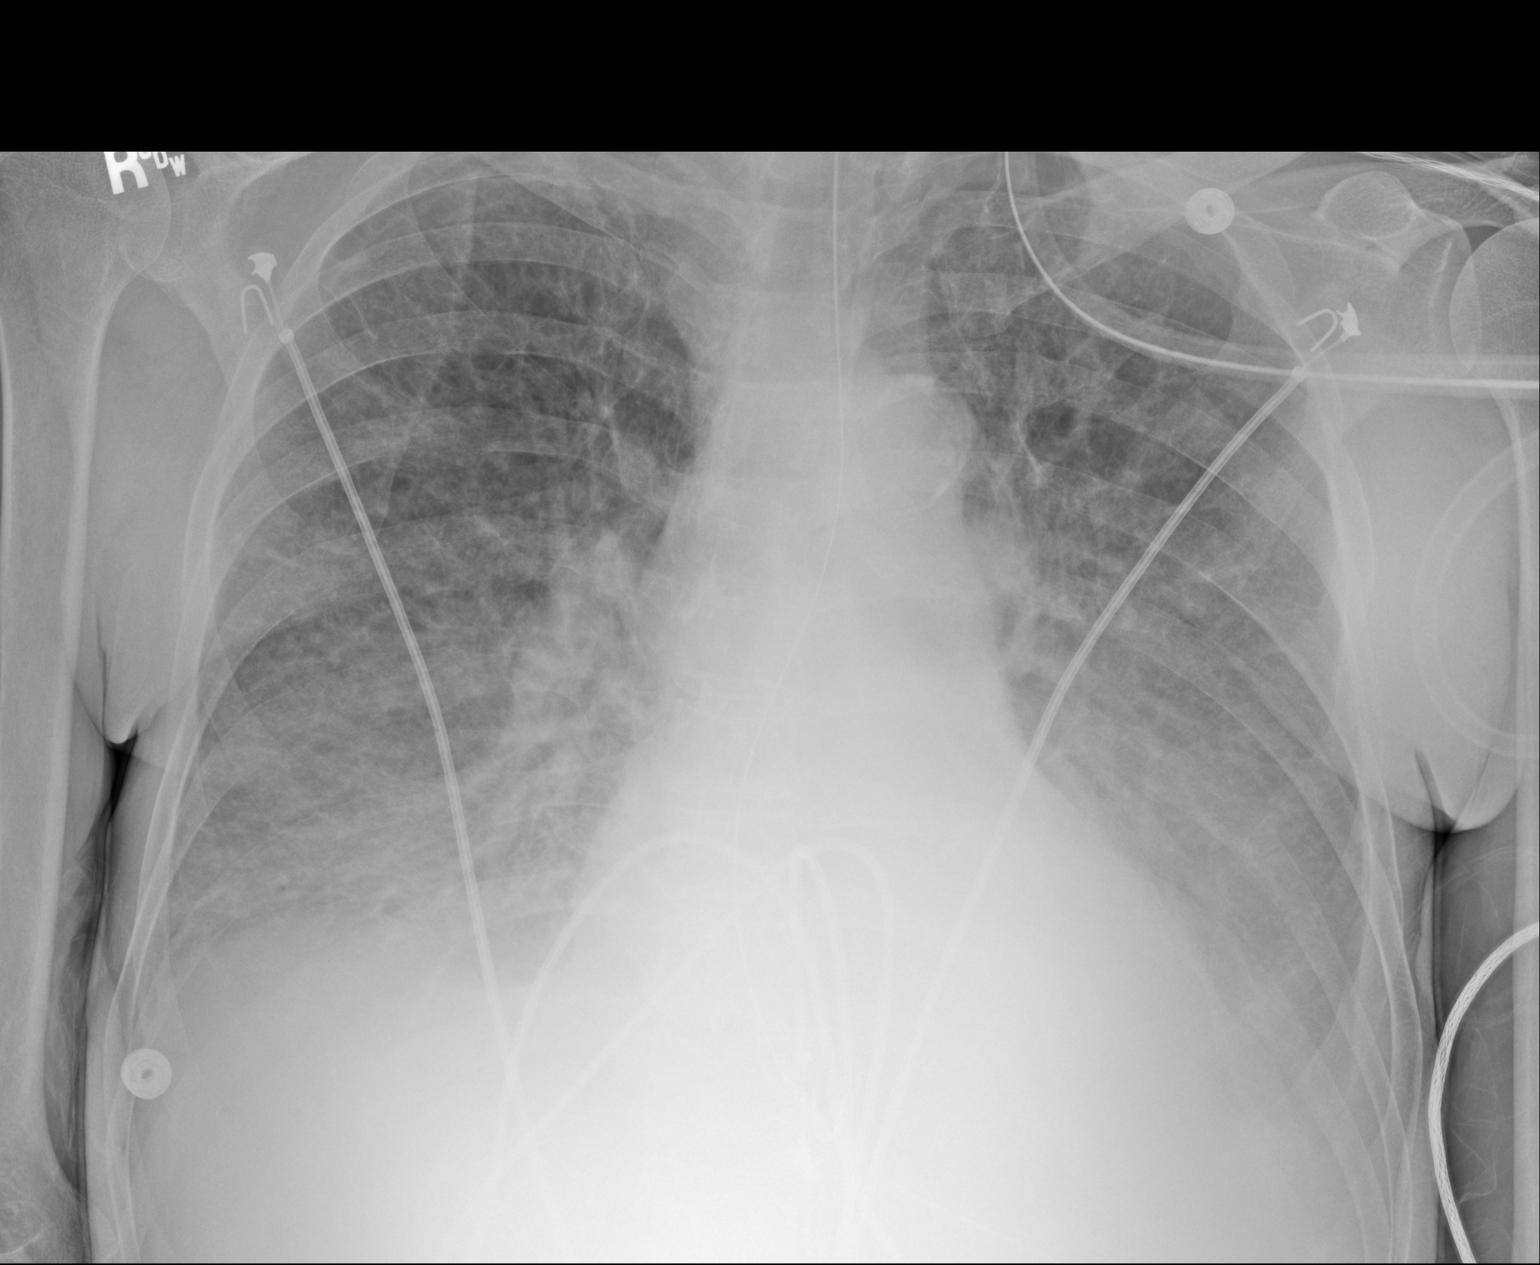

[1 of 1 positions shown; findings below may reference images not displayed]

FINDINGS: The patient's enteric tube is noted extending below the diaphragm.

Vascular congestion is noted. Increased interstitial markings raise
concern for pulmonary edema. Small bilateral pleural effusions are
seen. No pneumothorax is identified.

The cardiomediastinal silhouette is mildly enlarged. No acute
osseous abnormalities are identified.
IMPRESSION: Vascular congestion and mild cardiomegaly. Increased interstitial
markings raise concern for pulmonary edema. Small bilateral pleural
effusions seen.

## 2018-02-10 IMAGING — CR DG ABD PORTABLE 1V
1 series · 1 of 1 positions shown · non-contrast
Comparison: 10/06/2016

CLINICAL DATA: NG placement

EXAM:
PORTABLE ABDOMEN - 1 VIEW

[AP]
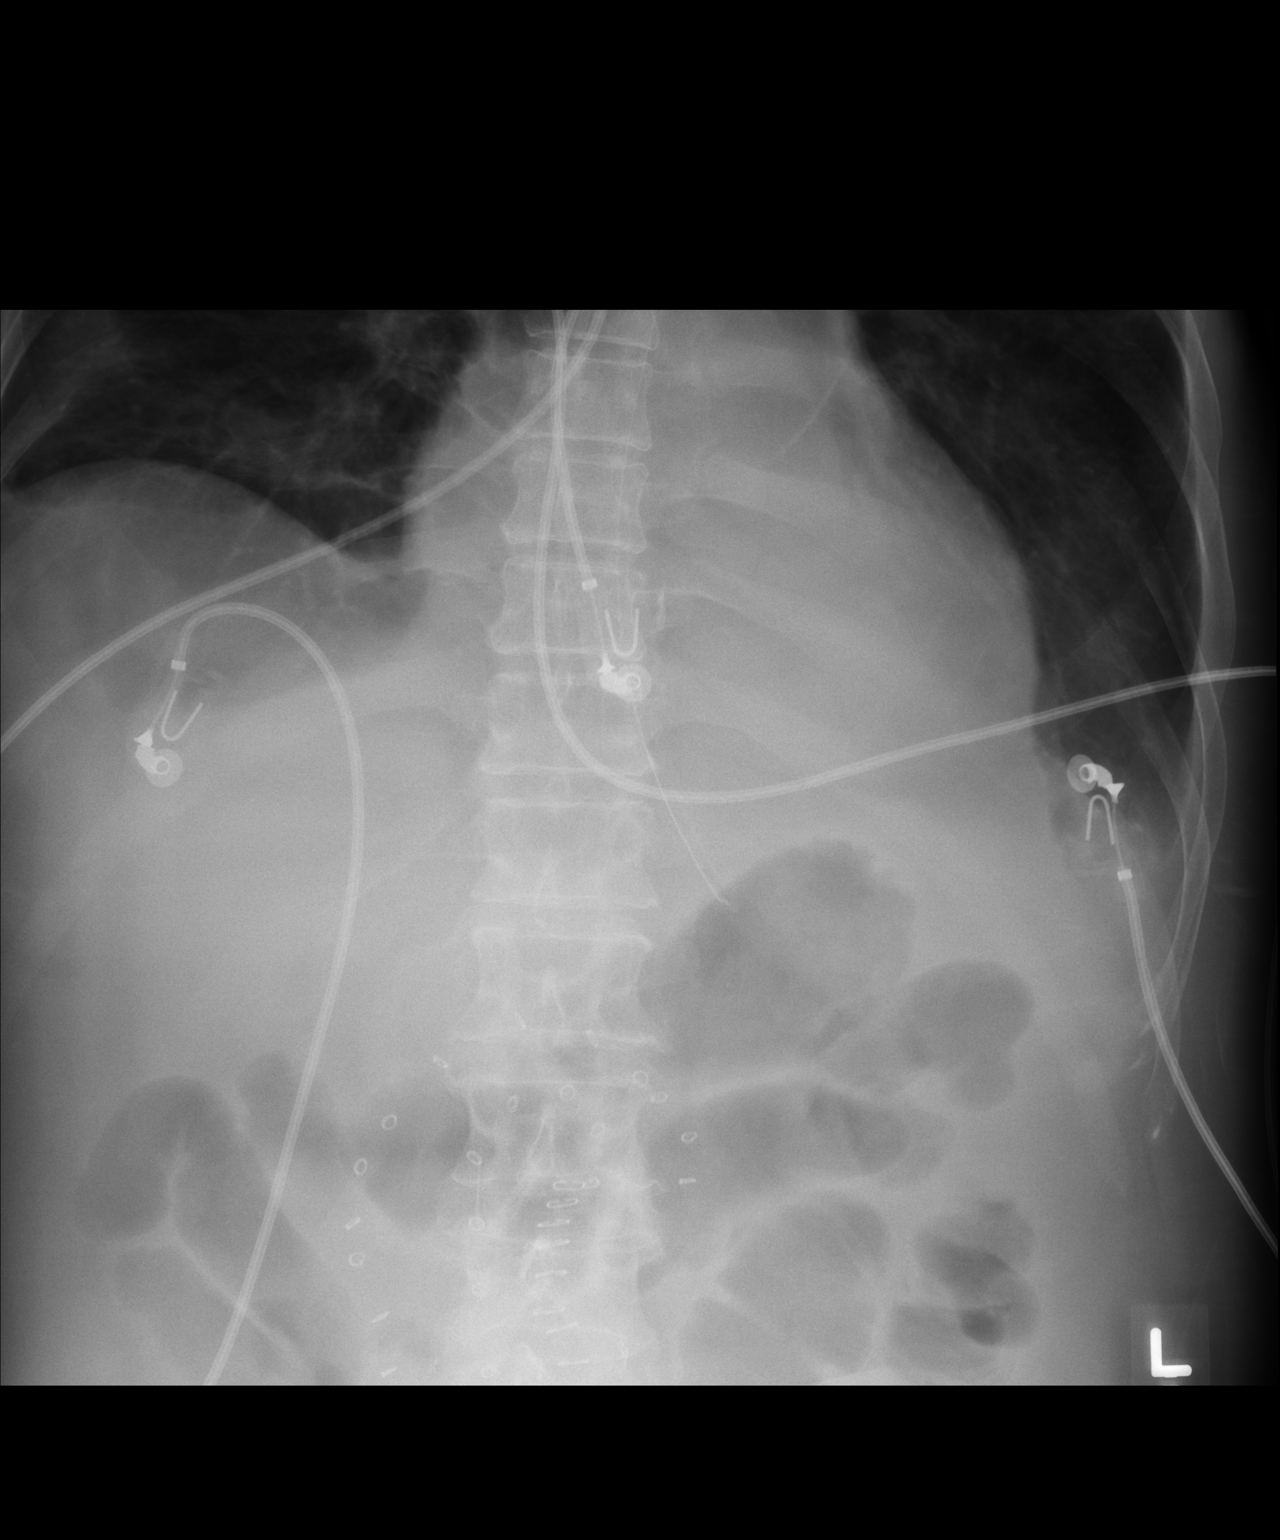

[1 of 1 positions shown; findings below may reference images not displayed]

FINDINGS: NG tube is been pulled back with the tip now in the proximal body of
the stomach. Nonobstructive bowel gas pattern.
IMPRESSION: NG pulled back into the proximal body of the stomach.

## 2018-02-10 IMAGING — CR DG ABD PORTABLE 1V
1 series · 1 of 1 positions shown · non-contrast
Comparison: Yesterday

CLINICAL DATA: Nasogastric tube replacement

EXAM:
PORTABLE ABDOMEN - 1 VIEW

[AP]
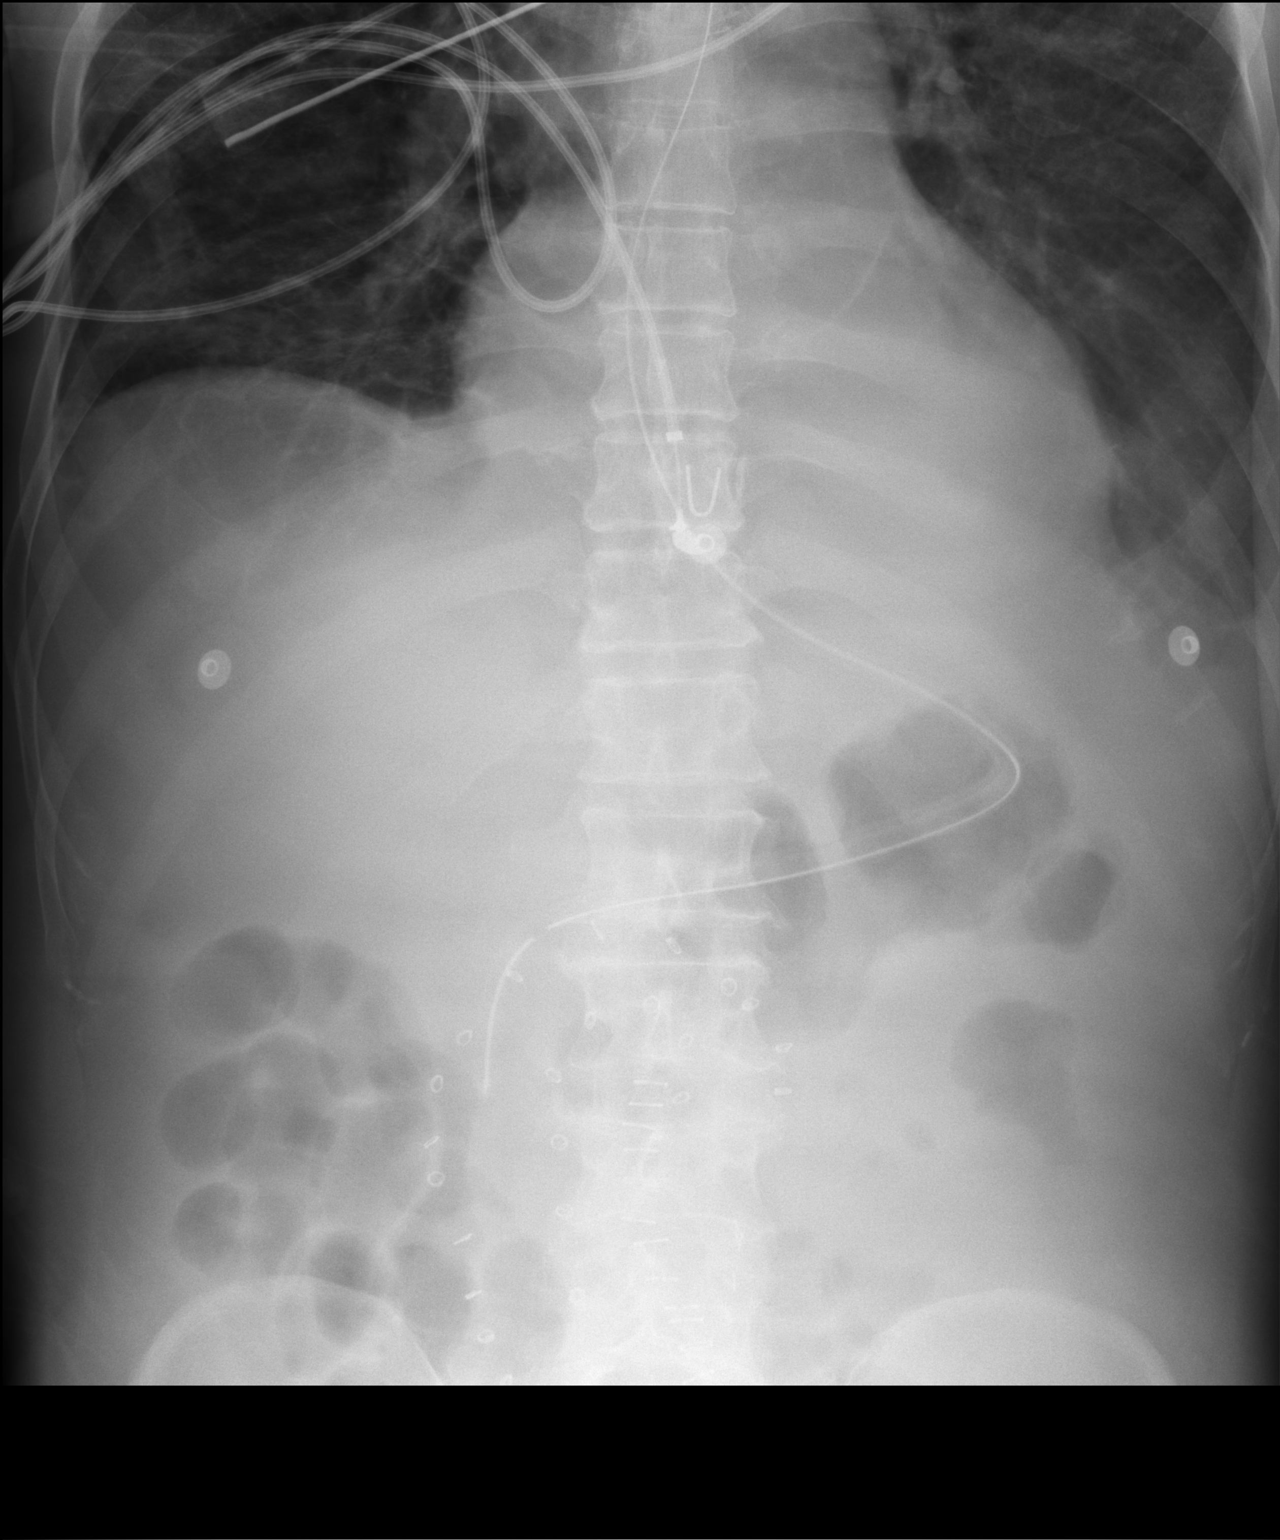

[1 of 1 positions shown; findings below may reference images not displayed]

FINDINGS: Nasogastric tube tip is at the descending segment duodenum.
Unchanged obscuration of the left diaphragm. Nonobstructive bowel
gas pattern.
IMPRESSION: Nasogastric tube tip is at the proximal duodenum.

## 2018-02-13 IMAGING — CR DG ABD PORTABLE 1V
1 series · 1 of 1 positions shown · non-contrast
Comparison: 10/07/2016

CLINICAL DATA: Gastric tube placement

EXAM:
PORTABLE ABDOMEN - 1 VIEW

[AP]
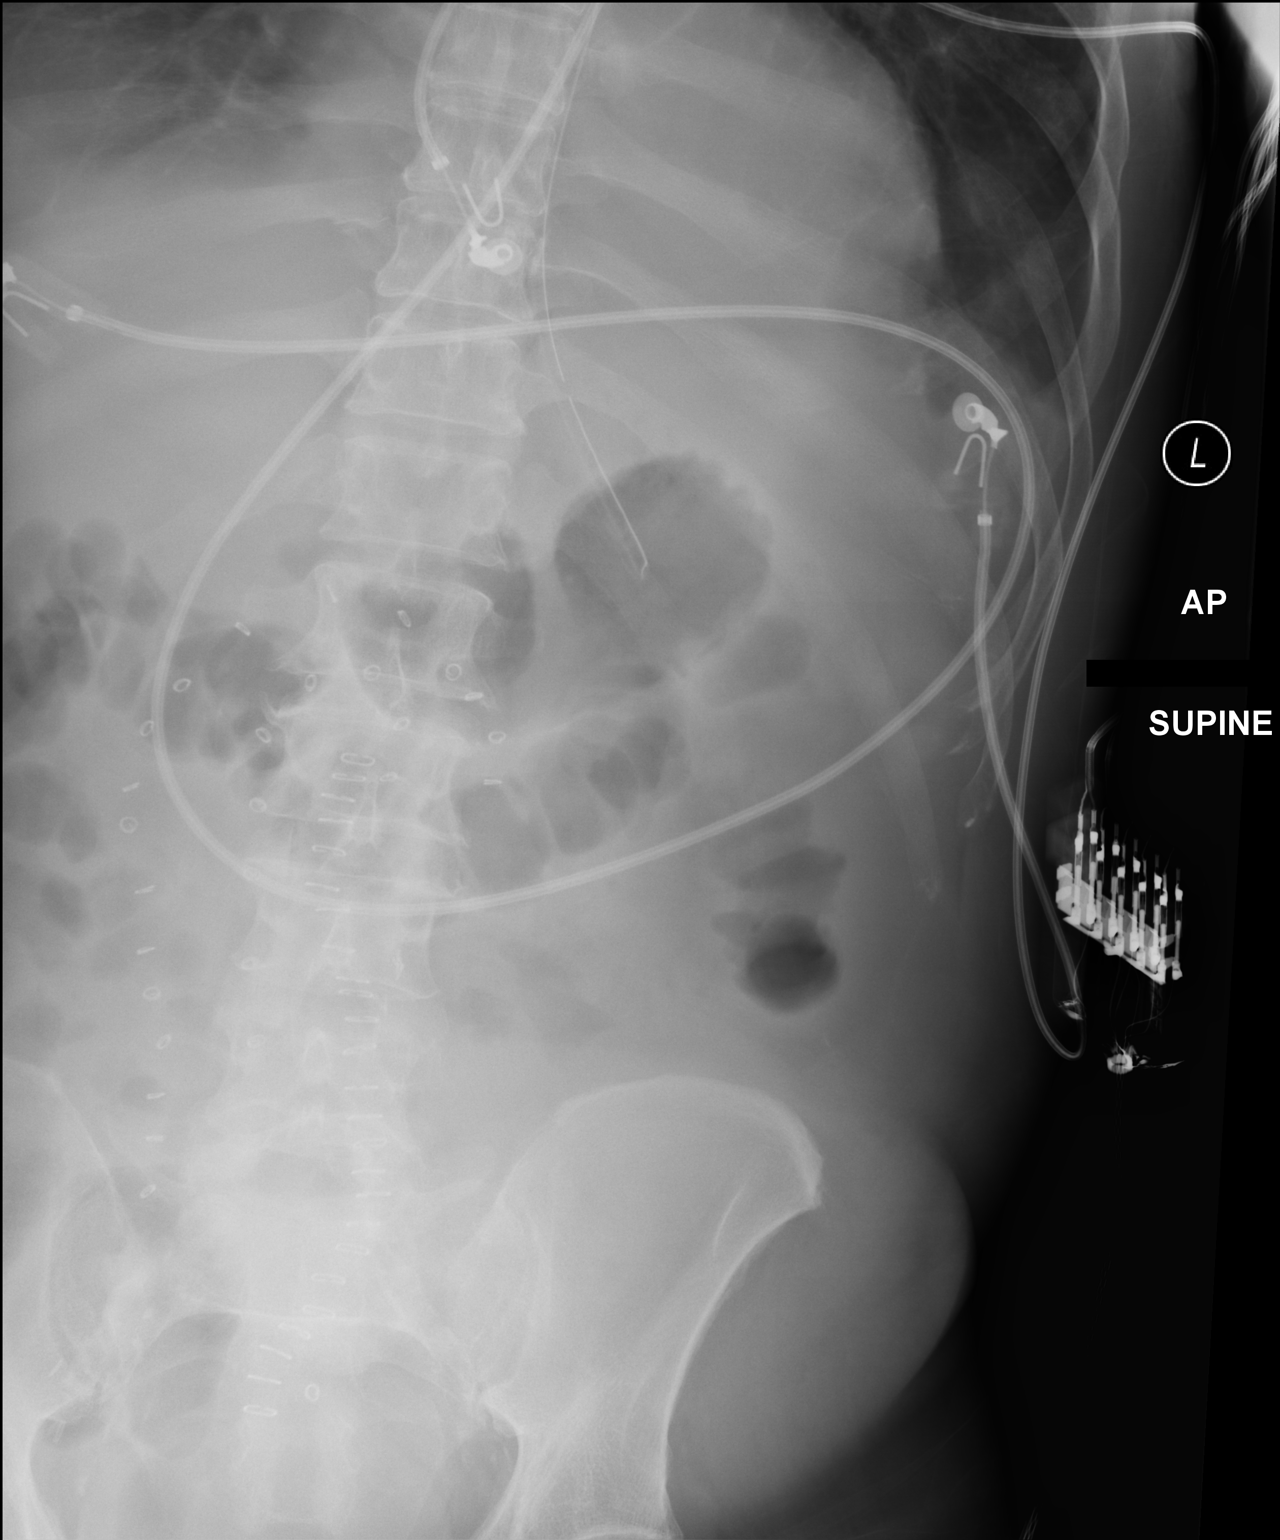

[1 of 1 positions shown; findings below may reference images not displayed]

FINDINGS: The end hole of a gastric tube is seen within the stomach at
approximately the junction of the fundus and body of the stomach.
The side port is likely at the GE junction. Further advancement is
recommended 5-7 cm.
IMPRESSION: The tip of a gastric tube is seen within the stomach however the
side port appears to be at level the GE junction and further
advancement is recommended as above.

## 2018-12-31 IMAGING — US US RENAL
1 series · 14 of 25 positions shown · non-contrast
Comparison: None.

CLINICAL DATA: Acute onset of renal insufficiency. Initial
encounter.

EXAM:
RENAL / URINARY TRACT ULTRASOUND COMPLETE

[Series 1: us renal · 0.20mm/px · 14 of 29 slices shown]
[im 1/29]
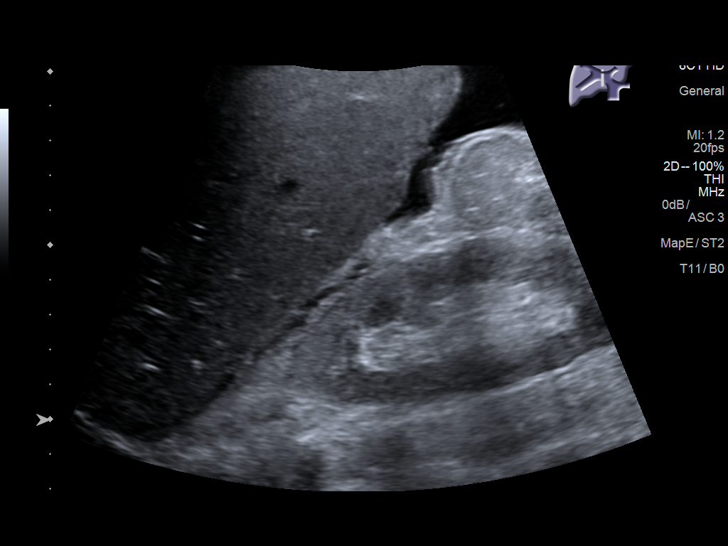
[im 3/29]
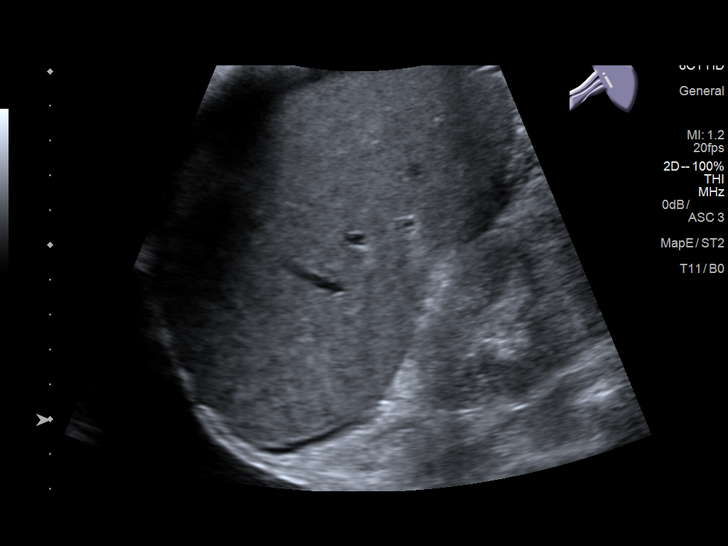
[im 5/29]
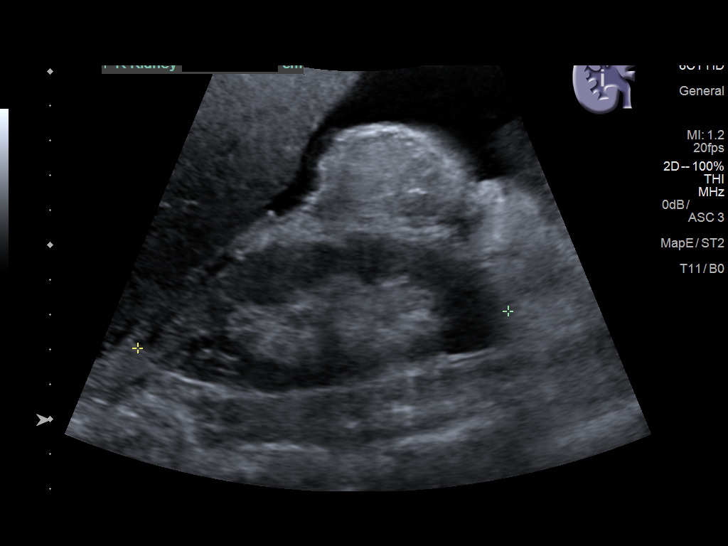
[im 8/29]
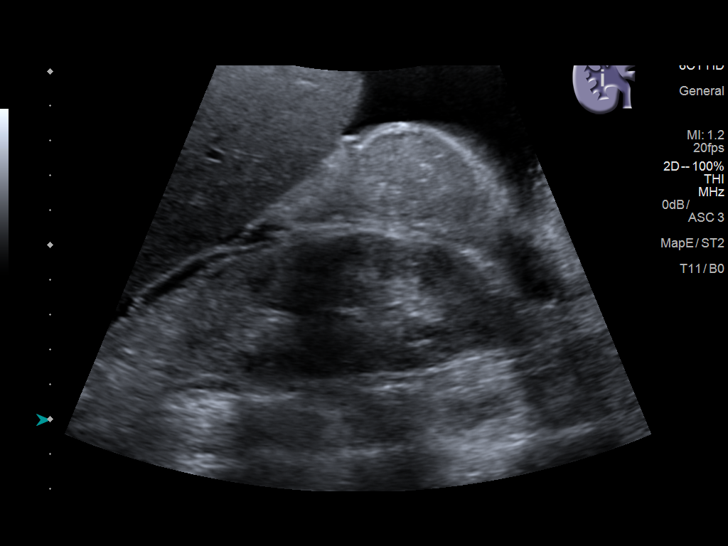
[im 10/29]
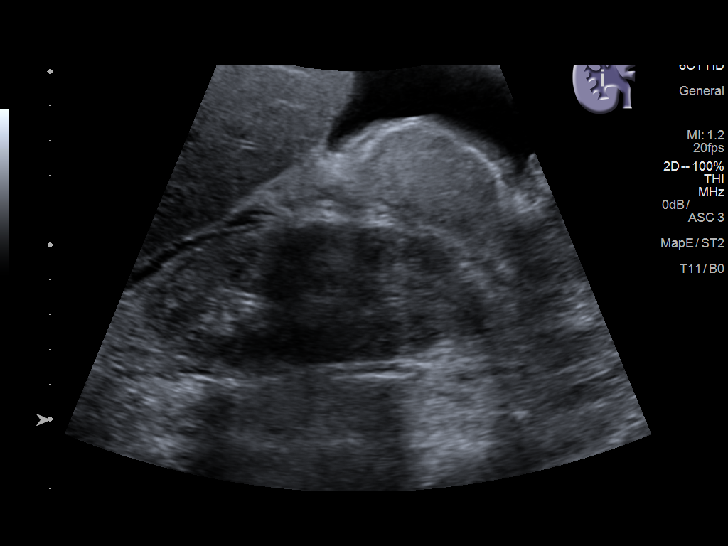
[im 11/29]
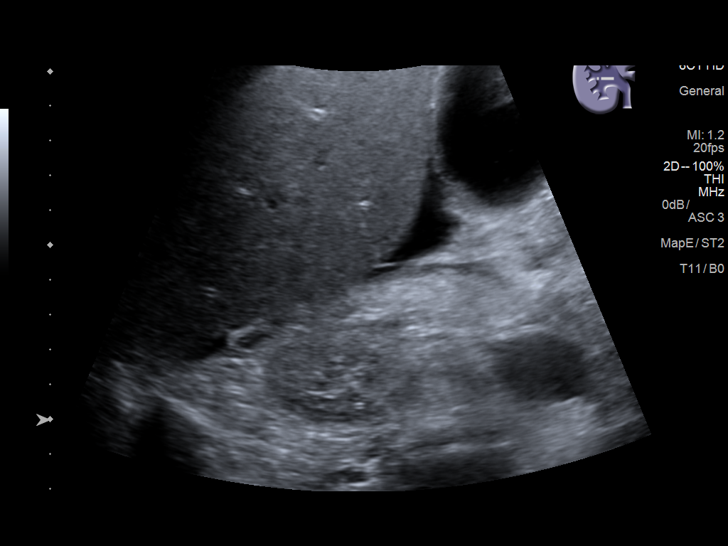
[im 13/29]
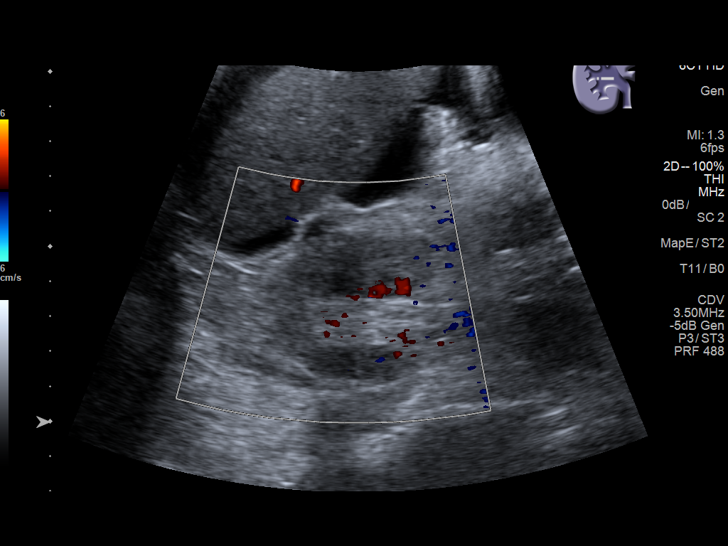
[im 16/29]
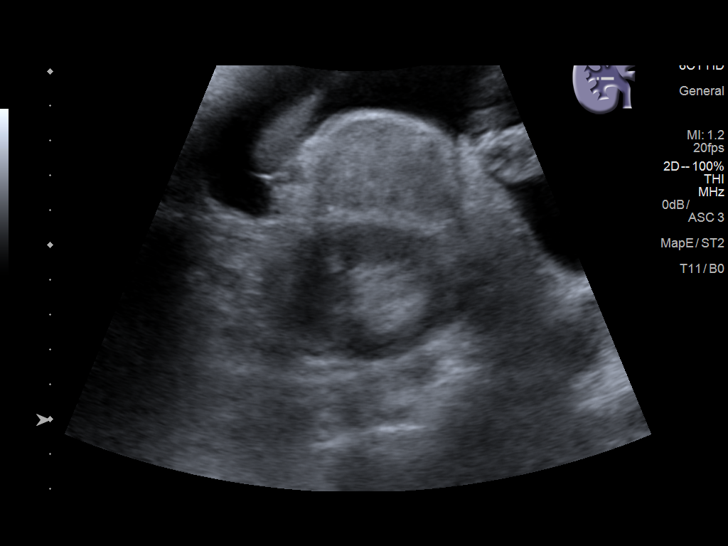
[im 18/29]
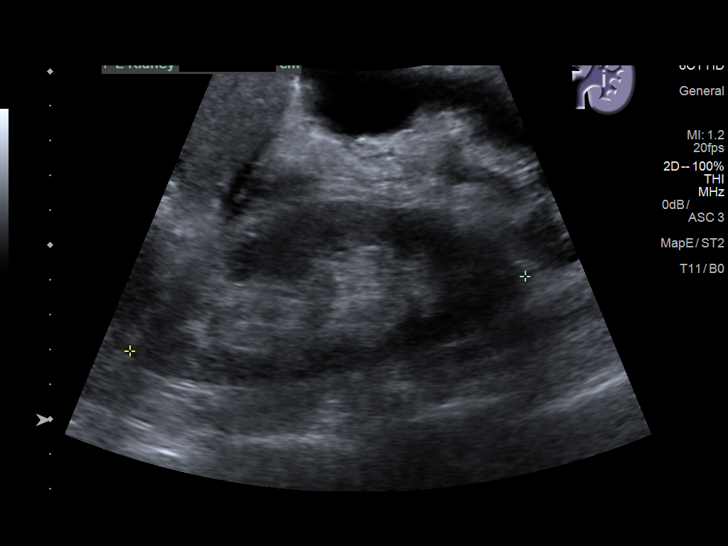
[im 19/29]
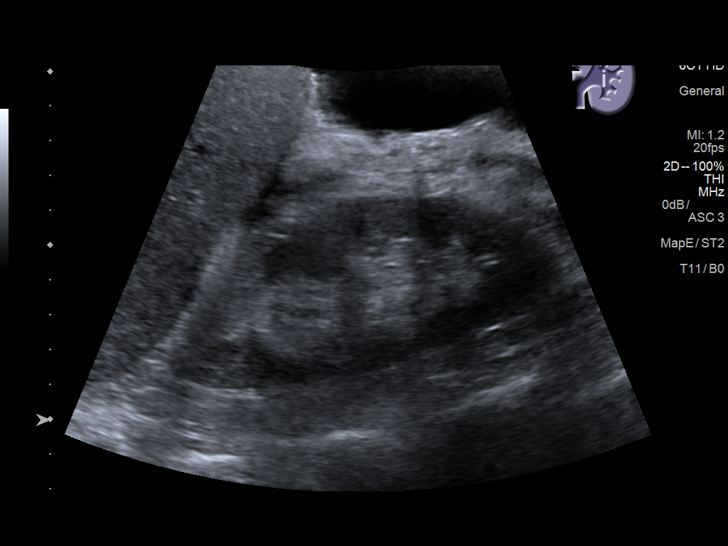
[im 22/29]
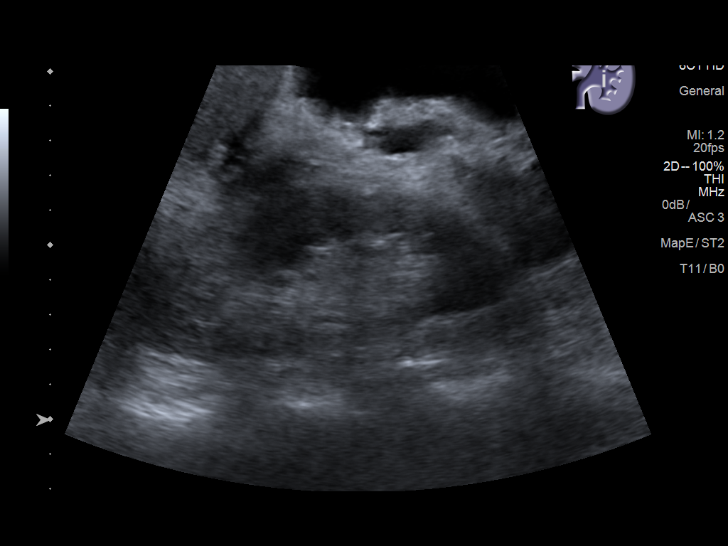
[im 24/29]
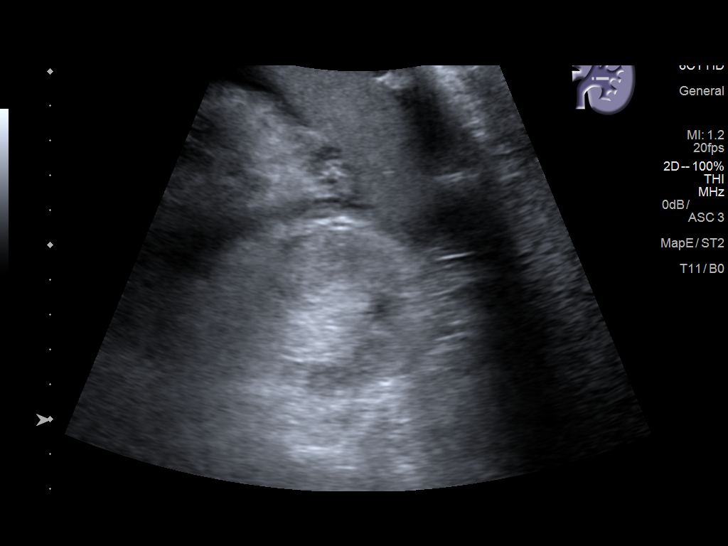
[im 26/29]
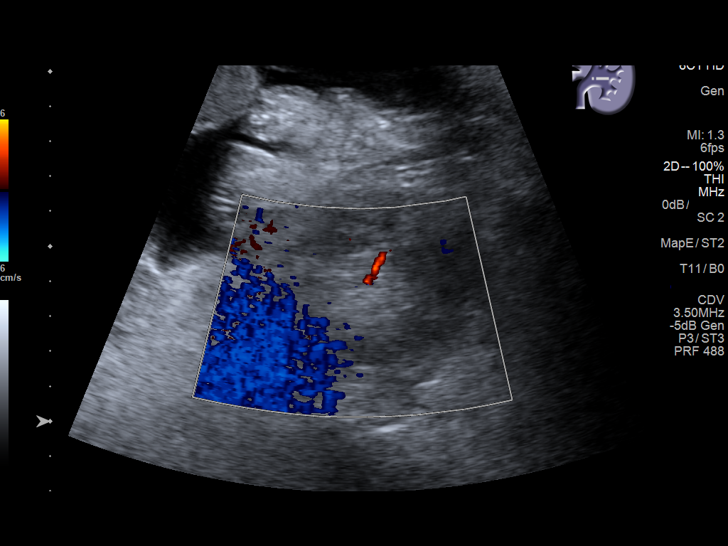
[im 29/29]
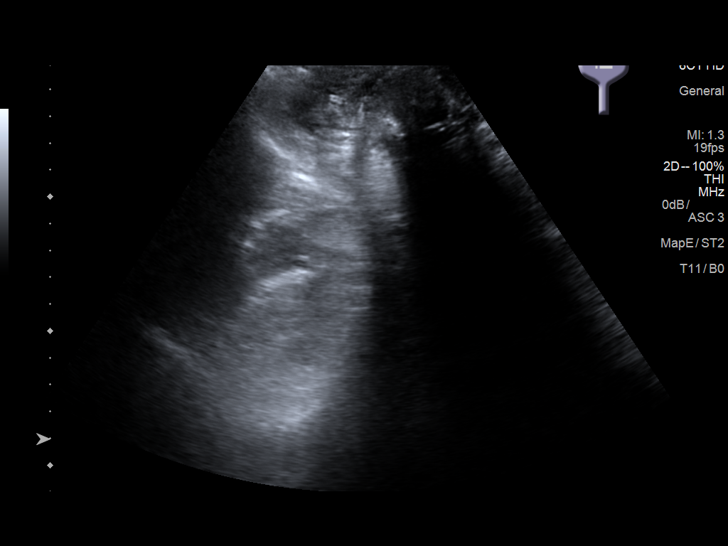

[14 of 25 positions shown; findings below may reference images not displayed]

FINDINGS: Right Kidney:

Length: 10.7 cm. Mildly increased parenchymal echogenicity
suggested. No mass or hydronephrosis visualized.

Left Kidney:

Length: 11.6 cm. Mildly increased parenchymal echogenicity
suggested. No mass or hydronephrosis visualized.

Bladder:

Decompressed and not well assessed, with a Foley catheter in place.

Moderate volume ascites is noted within the abdomen and pelvis.
IMPRESSION: 1. No evidence of hydronephrosis.
2. Suggestion of mildly increased renal parenchymal echogenicity,
which may reflect medical renal disease.
3. Moderate volume ascites within the abdomen and pelvis.
